# Patient Record
Sex: Male | Born: 1935 | Race: Black or African American | Hispanic: No | Smoking: Former smoker
Health system: Southern US, Community
[De-identification: ages and names within clinical notes are randomized; demographics above are authoritative.]

## PROBLEM LIST (undated history)

## (undated) DIAGNOSIS — E079 Disorder of thyroid, unspecified: Secondary | ICD-10-CM

## (undated) DIAGNOSIS — M1990 Unspecified osteoarthritis, unspecified site: Secondary | ICD-10-CM

## (undated) DIAGNOSIS — I2510 Atherosclerotic heart disease of native coronary artery without angina pectoris: Secondary | ICD-10-CM

## (undated) DIAGNOSIS — I10 Essential (primary) hypertension: Secondary | ICD-10-CM

## (undated) DIAGNOSIS — M549 Dorsalgia, unspecified: Secondary | ICD-10-CM

## (undated) HISTORY — PX: INTESTINAL BYPASS: SHX1099

## (undated) HISTORY — PX: BACK SURGERY: SHX140

## (undated) HISTORY — PX: OTHER SURGICAL HISTORY: SHX169

---

## 2006-01-25 ENCOUNTER — Inpatient Hospital Stay (HOSPITAL_COMMUNITY): Admission: EM | Admit: 2006-01-25 | Discharge: 2006-01-28 | Payer: Self-pay | Admitting: Emergency Medicine

## 2006-01-26 ENCOUNTER — Ambulatory Visit: Payer: Self-pay | Admitting: Cardiology

## 2006-02-04 ENCOUNTER — Encounter (HOSPITAL_COMMUNITY): Admission: RE | Admit: 2006-02-04 | Discharge: 2006-03-06 | Payer: Self-pay | Admitting: Oncology

## 2006-02-12 ENCOUNTER — Ambulatory Visit: Payer: Self-pay | Admitting: Cardiology

## 2006-03-18 ENCOUNTER — Ambulatory Visit: Payer: Self-pay | Admitting: Cardiology

## 2006-08-14 ENCOUNTER — Ambulatory Visit: Payer: Self-pay | Admitting: Cardiology

## 2006-11-23 ENCOUNTER — Inpatient Hospital Stay (HOSPITAL_COMMUNITY): Admission: RE | Admit: 2006-11-23 | Discharge: 2006-11-26 | Payer: Self-pay | Admitting: General Surgery

## 2006-11-23 ENCOUNTER — Encounter (INDEPENDENT_AMBULATORY_CARE_PROVIDER_SITE_OTHER): Payer: Self-pay | Admitting: General Surgery

## 2009-01-16 DIAGNOSIS — E785 Hyperlipidemia, unspecified: Secondary | ICD-10-CM

## 2009-01-16 DIAGNOSIS — R55 Syncope and collapse: Secondary | ICD-10-CM

## 2009-01-16 DIAGNOSIS — I10 Essential (primary) hypertension: Secondary | ICD-10-CM | POA: Insufficient documentation

## 2010-01-25 ENCOUNTER — Ambulatory Visit (HOSPITAL_COMMUNITY): Admission: RE | Admit: 2010-01-25 | Discharge: 2010-01-25 | Payer: Self-pay | Admitting: Pulmonary Disease

## 2010-01-31 ENCOUNTER — Ambulatory Visit: Payer: Self-pay | Admitting: Otolaryngology

## 2010-02-21 ENCOUNTER — Ambulatory Visit: Payer: Self-pay | Admitting: Otolaryngology

## 2010-10-22 NOTE — H&P (Signed)
[name], [name] NO.:  [id number]   MEDICAL RECORD NO.:  [id number]          PATIENT TYPE:  AMB   LOCATION:  DAY                           FACILITY:  [facility]   PHYSICIAN:  Dalia Heading, M.D.  DATE OF BIRTH:  January 10, 1936   DATE OF ADMISSION:  DATE OF DISCHARGE:  LH                              HISTORY & PHYSICAL   CHIEF COMPLAINT:  Neoplasm, colon.   HISTORY OF PRESENT ILLNESS:  Patient is a 75 year old black male who is  referred for evaluation and treatment of a neoplasm in the transverse  colon.  This was found on recent colonoscopy.  It was biopsy-negative  for malignancy but is highly suspicious.  No family history of colon  carcinoma is noted.   PAST MEDICAL HISTORY:  Hypothyroidism, coronary artery disease,  hypertension.   PAST SURGICAL HISTORY:  Thyroidectomy, left carotid endarterectomy,  lithotripsy.   CURRENT MEDICATIONS:  Synthroid, Coreg, baby aspirin, which he is  holding.   ALLERGIES:  No known drug allergies.   REVIEW OF SYSTEMS:  Patient denies drinking or smoking.  He denies any  recent chest pain, MI, CVA, or diabetes mellitus.   PHYSICAL EXAMINATION:  GENERAL:  Patient is a well-developed and well-  nourished black male in no acute distress.  NECK:  Supple without lymphadenopathy or carotid bruits.  LUNGS:  Clear to auscultation with equal breath sounds bilaterally.  HEART:  Regular rate and rhythm without S3, S4, or murmurs.  ABDOMEN:  Soft, nontender, nondistended.  No hepatosplenomegaly, masses,  or hernias are identified.   Colonoscopy revealed a suspicious lesion in the proximal to mid  transverse colon.   IMPRESSION:  Neoplasm, unspecified.   PLAN:  The patient is scheduled for an extended right hemicolectomy on  November 23, 2006.  The risks and benefits of the procedure, including  bleeding, infection, cardiopulmonary difficulties, and the possibility  of a blood transfusion were fully explained to the patient, who  gave  informed consent.      Dalia Heading, M.D.  Electronically Signed     MAJ/MEDQ  D:  11/12/2006  T:  11/12/2006  Job:  [id number]   cc:   [name]  Fax: [phone]

## 2010-10-22 NOTE — Discharge Summary (Signed)
NAMERYON, LAYTON NO.:  [id number]   MEDICAL RECORD NO.:  [id number]          PATIENT TYPE:  INP   LOCATION:  A301                          FACILITY:  [facility]   PHYSICIAN:  Dalia Heading, M.D.  DATE OF BIRTH:  [birth year]   DATE OF ADMISSION:  11/23/2006  DATE OF DISCHARGE:  06/19/2008LH                               DISCHARGE SUMMARY   HOSPITAL COURSE:  SUMMARY:  The patient is a 75 year old black male who  was found on colonoscopy to have a colon neoplasm in the mid-transverse  colon.  Pre-operative CEA level was 0.6.  He was taken to the operating  room on November 23, 2006 and underwent an extended right hemicolectomy.  He  tolerated the procedure well.  His postoperative course has been  unremarkable.  His diet was advanced without  difficulty.  Final  pathology is still pending.  He is being discharged home on  postoperative day #3 in good improving condition.   DISCHARGE INSTRUCTIONS:  The patient is followed up by Dr. Franky Macho  on December 01, 2006.   DISCHARGE MEDICATIONS:  Include:  1. Vicodin 1-2 tablets p.o. q.4 h. p.r.n. pain.  2. Amlodipine 5 mg p.o. daily.  3. Chlorthalidone 12.5 mg p.o. daily.   PRINCIPAL DIAGNOSES:  1. Colon neoplasm.  2. Hypertension.  3. Coronary artery disease.   PRINCIPAL PROCEDURE:  Extended right hemicolectomy on November 23, 2006.      Dalia Heading, M.D.  Electronically Signed     MAJ/MEDQ  D:  11/26/2006  T:  11/26/2006  Job:  [id number]   cc:   Dalia Heading, M.D.  Fax: [phone]   Chart

## 2010-10-22 NOTE — Op Note (Signed)
NAMEALASTOR, [name] NO.:  [id number]   MEDICAL RECORD NO.:  [id number]          PATIENT TYPE:  INP   LOCATION:  A301                          FACILITY:  [facility]   PHYSICIAN:  Dalia Heading, M.D.  DATE OF BIRTH:  1935-11-26   DATE OF PROCEDURE:  11/23/2006  DATE OF DISCHARGE:                               OPERATIVE REPORT   PREOPERATIVE DIAGNOSIS:  Colon neoplasm.   POSTOPERATIVE DIAGNOSIS:  Colon neoplasm, transverse colon.   PROCEDURE:  Extended right hemicolectomy.   SURGEON:  Dr. Franky Macho.   ANESTHESIA:  General endotracheal.   INDICATIONS:  The patient is a 75 year old black male who was found on  routine colonoscopy to have a mid transverse colon neoplasm.  Initial  biopsies were negative, but the neoplasm could not be removed.  The  patient comes the operating for an extended right hemicolectomy.  Risks  and benefits of the procedure including bleeding, infection,  cardiopulmonary difficulties, possible blood transfusion were fully  explained to the patient, who gave informed consent.   PROCEDURE NOTE:  The patient is placed in supine position.  After  induction of general endotracheal anesthesia, the abdomen was prepped  and draped in the usual sterile technique with Betadine.  Surgical site  confirmation was performed.   A midline incision was made from above the umbilicus to below the  umbilicus.  Peritoneal cavity was entered into without difficulty.  The  liver was inspected and noted to be within normal limits.  The  nasogastric tube was noted be in appropriate position in the stomach.  The small bowel was inspected and noted be within normal limits.  The  lesion appeared to be in mid to distal transverse colon.  The right  colon was mobilized along its peritoneal reflection.  A GIA stapler was  placed across the terminal ileum as well as distal to the tumor in the  distal transverse colon.  The mesentery was then divided using the  LigaSure.  The specimen was then opened in the operating room and the  tumor was within the specimen removed.  The specimen sent to pathology  further examination.  A side-to-side ileocolic anastomosis was then  performed using a GIA 70 stapler.  The enterotomy was closed using a TA  stapler.  The staple line was bolstered using 3-0 silk sutures.  Omentum  was then placed over the anastomosis.  This was secured using 3-0 silk  sutures.  The mesenteric defect was closed using a 0-0 chromic gut  running suture.  The abdominal cavity was then copiously irrigated with  gentamicin and normal saline.  All surgical personnel then changed their  gloves.   The bowel was returned to the abdominal cavity in orderly fashion.  The  fascia was reapproximated using a looped 0-0 Novofil running suture.  Subcutaneous layer was irrigated with normal saline and the skin was  closed using staples.  Betadine ointment and dry sterile dressings were  applied.   All tape and needle counts correct end the procedure.  The patient was  extubated in the operating room went back to  recovery room awake in  stable condition.   COMPLICATIONS:  None.   SPECIMEN:  Right colon.   BLOOD LOSS:  Less than 100 mL      Dalia Heading, M.D.  Electronically Signed     MAJ/MEDQ  D:  11/23/2006  T:  11/23/2006  Job:  [id number]

## 2010-10-25 NOTE — H&P (Signed)
[name], COURTWRIGHT NO.:  [id number]   MEDICAL RECORD NO.:  [id number]          PATIENT TYPE:  INP   LOCATION:  A212                          FACILITY:  [facility]   PHYSICIAN:  Hanley Hays. Dechurch, M.D.DATE OF BIRTH:  1936/05/20   DATE OF ADMISSION:  01/25/2006  DATE OF DISCHARGE:  LH                                HISTORY & PHYSICAL   PRIMARY CARE PHYSICIAN:  Dr. Ivory Broad, [place].   This is the first [name] admission for this 75 year old [place] American  gentleman with a history of hypertension who was in his usual state of  health until today when he was at church. He had syncope without warning  while singing.  He was witnessed and apparently fell to the floor over a  bench while singing a hymn.  He was noted to slump forward, grasp the pew in  front of him and tumbled onto the forward pew.  The patient denies any  forewarning or presyncopal-type symptoms.  He apparently had no seizure  activity or incontinence.  He came to lying on his back, unaware of what  transpired, and was fully alert and oriented.  Reportedly, there were some  PVCs noted on telemetry prior to being hooked up to the hard-wired  telemetry, but there was no other arrhythmia or pauses.  He has had no  previous history of syncope or similar symptoms.  He is very active.  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  He denies any dyspnea on  exertion, orthopnea, PND, or chest pain.  He has noted he has felt a fatigue  over the last 3-4 months more than usual but not enough to stop him.  In  any event, the patient is being admitted to the hospital for further  evaluation and treatment.   PAST MEDICAL HISTORY:  Is remarkable for:  1. Hypertension.  2. Nephrolithiasis, status post what sounds like lithotripsy.  3. A remote history of left neck surgery by ENT, question parotid tumor      versus other, without any adjuvant therapies.  He has not been      hospitalized.   He  is allergic to LATEX.   MEDICATIONS:  An unknown blood pressure pill daily and a regular aspirin  daily.  No supplements, over-the-counter products.   SOCIAL HISTORY:  He is widowed.  He has three sons who are alive and well.  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 Denies any alcohol use or abuse.  Smoked  until 1982 but less than a pack per week but is exposed to secondhand smoke.  He is active, mows was own lawn, goes to church regularly.   FAMILY MEDICAL HISTORY:  Two sisters alive and well.  Two brothers alive and  well.  One brother died with a history of diabetes and complications  thereof. Mother died at age 77-1/2 of a heart attack, and father had a  stroke in his late 93s.   REVIEW OF SYSTEMS:  As noted per the HPI.  No GI or GU complaints, sleeps  well.  No weight change, good appetite.  No other symptoms, no orthostasis.  Remote history of migraines but none in many years.   PHYSICAL EXAMINATION:  GENERAL: Reveals a well-developed, well-nourished  gentleman, alert, appropriate.  No distress.  VITAL SIGNS: Blood pressure is 160/100.  NECK:  Supple.  No JVD.  Question of a soft bruit in the left carotid.  PULSES:  Carotid upstrokes are full. Peripheral pulses are all full and  normal.  HEENT: Oropharynx is moist.  He has scarring at the left TMJ area with no  adenopathy.  HEART: Regular rate and rhythm.  No murmur, gallop, rub.  LUNGS:  Clear to auscultation, anterior posterior.  ABDOMEN: Soft, nontender.  EXTREMITIES: No clubbing, cyanosis, no edema.  SKIN: No skin rash, lesion or breakdown.  NEUROLOGIC: Exam is intact.  Blood pressure is not orthostatic.   ASSESSMENT/PLAN:  Syncope without warning.  The patient is admitted to  telemetry. He has a modestly elevated D-dimer at 0.77, but will proceed with  CT imaging of his chest to rule out pulmonary embolus and proceed from  there.  1.  Hypertension, not well controlled.  Will obtain the name of medication      had go ahead and begin and adjust his regimen as indicated.      Echocardiogram, carotid ultrasound are pending.  CT of the brain was      unremarkable.  2. Borderline hypokalemia with potassium of 3.7.  Will go ahead and      proceed with potassium supplementation.  Check magnesium. Will also      check a TSH given his complaints of fatigue over the last several      months. Plan discussed with the patient who expressed reasonable      understanding.  The patient remains a full code.      Hanley Hays Josefine Class, M.D.  Electronically Signed     FED/MEDQ  D:  01/25/2006  T:  01/25/2006  Job:  [id number]

## 2010-10-25 NOTE — Letter (Signed)
February 12, 2006     Dr. Mervyn Skeeters. Haq  [address]  [place], [place] [address]   RE:  [name]  MRN:  [id number]  /  DOB:  [birth year]   Dear Dr. Zola Button:   [name] returns to the office following a recent admission to [facility] for syncope. The mechanism was thought to be neurocardiogenic.  His echocardiogram revealed LVH without other significant structural  abnormalities. A nuclear stress study was negative for ischemia or  infarction. He has done well with minimal orthostatic hypotensive symptoms  in the mornings. He has refrained from driving. His prior work entailed  driving a school bus.   Current medications include amlodipine 5 mg daily and aspirin 325 mg daily.   On exam, a pleasant gentleman in no acute distress. The weight is 242, blood  pressure 150/90, heart rate 80 and regular, respiratory rate 16. NECK:  No  jugular venous distention; normal carotid upstrokes without bruit. HEENT:  Anicteric sclerae; normal lids and conjunctivae. Normal oral mucosa. SKIN:  No significant lesions. LUNGS: Clear. CARDIAC:  Normal first and second  heart sounds; fourth heart sound and minimal systolic ejection murmur  present. ABDOMEN:  Soft and nontender;  no bruit;  no organomegaly.  EXTREMITIES:  No edema; distal pulses intact. NEUROMUSCULAR:  Symmetric  strength and tone;  normal cranial nerves. PSYCHIATRIC:  Alert and oriented;  normal affect.   IMPRESSION:  [name] is doing well from a symptomatic standpoint. We  discussed the issue of driving. Based upon state law, he is precluded from  operating a motor vehicle. He should not drive commercially. I do not  believe that the risk of recurrent syncope is high in his operating a  private automobile, but did explain the state policy to him.   Hypertensive control is important in light of his secondary manifestations  of hypertension, such as LVH. We will change amlodipine to Lotrel 5/20 mg  and check a  chemistry profile and lipid profile in one month. He will be  seen by the cardiology nurses in one month for reassessment of blood  pressure control. He will monitor blood pressure at home and record values  for [place]. I will see him again in four months.    Sincerely,      Gerrit Friends. Dietrich Pates, MD, [facility]   RMR/MedQ  DD:  02/12/2006  DT:  02/12/2006  Job #:  [id number]

## 2010-10-25 NOTE — Letter (Signed)
August 14, 2006    Lacie Scotts. Zola Button, M.D.  [address]  [place], [place] [address]   RE:  MONTRICE, GRACEY  MRN:  [id number]  /  DOB:  April 23, 1936   Dear Dr. Zola Button:   Mr. Cerone returns to the office, now more than 6 months following  admission to [facility] with syncope.  He has also had  hypertension that has been suboptimally controlled in the past.  He has  had no further light-headedness and no further loss of consciousness.  His monitored blood pressures at home with good diastolic results, but  what sounds like somewhat suboptimal systolic results.  The patient  reports some sort of neck surgery at [facility] and also relates  that there was a problem with a blood vessel.  We will seek those  records to determine exactly what occurred.   CURRENT MEDICATIONS:  1. Aspirin 81 mg daily.  2. Chlorthalidone 12.5 mg daily.  3. Amlodipine 5 mg daily.   EXAM:  Pleasant, robust gentleman.  The weight is 246, 4 pounds more than 4 months ago.  Blood pressure  initially 155/90, subsequently 135/80.  Heart rate 75 and regular.  Respirations 16.  NECK:  No jugular venous distension; normal carotid upstrokes without  bruits.  LUNGS:  Clear.  CARDIAC:  Normal first and second heart sounds; grade 2/6 basalar  systolic ejection murmur.  ABDOMEN:  Soft and nontender; no organomegaly.  EXTREMITIES:  No edema.   IMPRESSION:  Mr. Righi is doing generally well.  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  He will discuss  this with the State of [place] if he desires to pursue that capability.  Blood pressure control looks good at present.  He will keep a record of  values at home and return in 3 months for reassessment.  A chemistry  profile will be obtained in light of his use of chlorthalidone.  If  blood pressures are suboptimal, it will be most reasonable to increase  his doses of amlodipine and/or chlorthalidone.    Sincerely,      Gerrit Friends. Dietrich Pates, MD, [facility]  Electronically Signed    RMR/MedQ  DD: 08/14/2006  DT: 08/14/2006  Job #: [id number]

## 2010-10-25 NOTE — Procedures (Signed)
[name], [name] NO.:  [id number]   MEDICAL RECORD NO.:  [id number]          PATIENT TYPE:  INP   LOCATION:  A212                          FACILITY:  [facility]   PHYSICIAN:  Gerrit Friends. Dietrich Pates, MD, FACCDATE OF BIRTH:  [birth year]   DATE OF PROCEDURE:  01/26/2006  DATE OF DISCHARGE:                                  ECHOCARDIOGRAM   REFERRING:  Dr. Josefine Class   CLINICAL DATA:  A 75 year old gentleman with syncope.   1. Technically suboptimal and limited echocardiographic study.  2. Normal left atrium, right atrium, and right ventricle.  3. Mild mitral valve calcification; systolic anterior motion of the      chordae; mild mitral annular calcification.  4. Tricuspid valve not well seen, but is grossly normal.  5. Mild sclerosis of the aortic valve.  6. Aortic root is mildly dilated with calcified walls.  7. Normal left ventricular size; mild to moderate hypertrophy;      hyperdynamic regional and global function.  8. Normal pulmonic artery.      Gerrit Friends. Dietrich Pates, MD, [facility]  Electronically Signed     RMR/MEDQ  D:  01/26/2006  T:  01/27/2006  Job:  [id number]

## 2010-10-25 NOTE — Consult Note (Signed)
[name], STAIRS NO.:  [id number]   MEDICAL RECORD NO.:  [id number]          PATIENT TYPE:  INP   LOCATION:  A212                          FACILITY:  [facility]   PHYSICIAN:  Gerrit Friends. Dietrich Pates, MD, FACCDATE OF BIRTH:  [birth year]   DATE OF CONSULTATION:  01/28/2006  DATE OF DISCHARGE:                                   CONSULTATION   CARDIOLOGY CONSULTATION   REFERRING PHYSICIAN:  Dennison Nancy, M.D.   HISTORY OF PRESENT ILLNESS:  A 75 year old gentleman admitted to hospital  after a syncopal spell.  [name] has no cardiovascular history other  than longstanding hypertension for which he has been treated with a number  of agents, apparently with suboptimal control.  He has not previously  experienced syncope nor dizziness.  He has not previously been seen by a  cardiologist nor undergone any significant cardiac testing.  He been in  church for approximately 2 hours, seated for 1 hour.  When he arose to sing  a hymn, he subsequently was noted to be unsteady on his feet; and then to  slump forward over the pew in front of him.  He struck his upper lip, but  did not note any symptoms of head trauma after he awoke.  He apparently was  unresponsive for a brief period of time.   The rescue squad evaluated him and offered to transport him to the hospital  in [place]; however, he wished to come to [facility] and was  brought here by private automobile.  His workup has been negative including  only modest orthostatic change in blood pressure, an echocardiogram that  shows LVH and mild aortic root dilatation without other significant  findings, negative carotid ultrasound and negative CT.  He also had a CT of  the chest that shows lower lobe atelectasis and cardiomegaly with LVH.  A  nodule was identified in his thyroid gland.   PAST MEDICAL HISTORY:  Notable for nephrolithiasis with prior lithotripsy.  There was a remote surgical exploration of the  left neck--it is unclear what  the exact procedure was.   ALLERGY:  To LATEX.   CURRENT MEDICATIONS:  The only medication appears to be Cardura and aspirin.   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  Remote history of modest cigarette smoking.   FAMILY HISTORY:  Siblings are negative except for one brother with diabetes.  Mother lived to almost 67 and suffered a myocardial infarction.  Father had  a stroke, also at an advanced age.   REVIEW OF SYSTEMS:  Notable for no exertional symptoms, no palpitations, and  a remote history of migraine headache.  All other systems reviewed and are  negative.   PHYSICAL EXAMINATION:  GENERAL:  On exam, pleasant gentleman in no acute  distress.  VITAL SIGNS:  The blood pressure is 160/95 with approximately 10 points of  systolic and diastolic orthostatic change; respirations 16; heart rate 70  and regular.  HEENT:  Anicteric sclerae; pupils equal, round, react to light.  NECK:  No jugular venous distension; normal carotid upstrokes without  bruits.  ENDOCRINE:  No thyromegaly.  HEMATOPOIETIC:  No adenopathy.  SKIN:  No significant lesions.  LUNGS:  Clear.  CARDIAC:  Normal first and second heart sounds; fourth heart sound present.  ABDOMEN:  Soft and nontender; no masses; no organomegaly.  EXTREMITIES:  Normal distal pulses; no edema.  NEUROMUSCULAR:  Symmetric strength and tone; normal cranial nerves.  MUSCULOSKELETAL:  No joint deformities.  PSYCHIATRIC:  Alert and oriented; normal affect.   EKG:  Normal sinus rhythm; first-degree AV block; nondiagnostic inferior Q  waves; ST-T wave abnormalities consistent with anterolateral ischemia or  LVH.   IMPRESSION:  [name] has longstanding hypertension with inadequate  control.  Cardura is not an appropriate first drug.  Amlodipine will be  started at  a dose of 5 mg daily.  I would add ACE inhibitor if that proves  inadequate.  Since he has borderline significant orthostatic change in blood  pressure, I would not favor diuretic as an initial drug.  Beta blocker is  relatively contraindicated due to conduction system disease.   There is no indication in this episode of ischemic heart disease; however,  the patient has significant risk factors plus EKG abnormalities.  We will  proceed with an outpatient stress nuclear study.   The likely mechanism for syncope is neurocardiogenic.  Behavioral mechanisms  for countering this were discussed with the patient.  He will call me if he  experiences recurrent symptoms.  Otherwise, he will follow-up with his  primary medical doctor.      Gerrit Friends. Dietrich Pates, MD, [facility]  Electronically Signed     RMR/MEDQ  D:  01/28/2006  T:  01/28/2006  Job:  [id number]

## 2010-10-25 NOTE — Discharge Summary (Signed)
NAMEJERRITT, CARDOZA              ACCOUNT NO.:  [id number]   MEDICAL RECORD NO.:  [id number]          PATIENT TYPE:  INP   LOCATION:  A212                          FACILITY:  [facility]   PHYSICIAN:  Lonia Blood, M.D.      DATE OF BIRTH:  June 27, 1935   DATE OF ADMISSION:  01/25/2006  DATE OF DISCHARGE:  08/22/2007LH                                 DISCHARGE SUMMARY   PRIMARY CARE PHYSICIAN:  Dr. Edwyna Shell in [place], [place].   DISCHARGE DIAGNOSES:  1. Acute syncopal episode, probably vasovagal, with negative workup.  2. Hypertension.  3. History of previous nephrolithiasis.  4. Remote history of left neck surgery.   DISCHARGE MEDICATIONS:  1. Amlodipine 5 mg daily.  2. Aspirin 325 mg daily.   DISPOSITION:  The patient is currently stable.  No recurrence of syncopal  episode.  He will probably need outpatient stress test.  He has had one done  before.  He has been seen by Cardiology, and the recommendation is for him  to have that done as an outpatient.  Also, if he needs further treatment for  his hypertension, and ACE inhibitor should be placed.   PROCEDURES:  1. Chest x-ray on January 25, 2006, showed cardiomegaly with mild right      base subsegmental atelectasis.  2. CT of head without contrast showed no acute findings.  3. CT angiogram of the chest also showed no evidence of pulmonary      embolism, mild atelectasis in the lower lobes, no acute cardiopulmonary      disease, cardiomegaly with evidence of left ventricular hypertrophy,      solid nodule involving the lobe of the right lobe of the thyroid gland.  4. Carotid Duplex on January 26, 2006, showed no evidence of      hemodynamically significant stenosis on either side.  5. A thyroid ultrasound on August 21 showed dominant solid nodule      associated with lower pole of the right lower thyroid, consider biopsy      and further evaluation.  Will defer this to Dr. Edwyna Shell to arrange thyroid      biopsy.   CONSULTATIONS:   [facility], Dr. Dietrich Pates.   HISTORY OF PRESENT ILLNESS:  Please refer to dictated History and Physical  on admission by Dr. Renne Musca.   The patient is a 75 year old African American male with known history of  hypertension.  The patient had apparently syncopal episode while in the  church while singing.  He fell to the floor over a bench while singing in  the church.  This is his first ever episode and no prior known cardiac  history except for his hypertension.  He was subsequently admitted for  workup of syncopal episode.   HOSPITAL COURSE:  #1 - SYNCOPAL EPISODE.  There was no recurrence in the  hospital.  On admission, the patient's EKG showed what was thought to be  first degree AV block.  That, however, has resolved.  The thinking here is  that the patient had a 2D echocardiogram also that actually was relatively  normal except  for mild dilatation of the aortic root.  The thinking  therefore is that the patient may have had vasovagal syncopal episode.  His  carotid Duplex was negative . Workup including checking his lipid profile  was pending but TSH was within normal limits.  His D. dimer was slightly  elevated at 0.71, but the chest CT was negative.  With these findings, the  patient will be discharged and continue his workup as an outpatient.  We  have tried to get him to have a stress test done.  However, this is being  deferred to be done as an outpatient per Cardiology.   #2 - HYPERTENSION.  His blood pressure has been minimally uncontrolled.  He  has been on Cardura; however, that was deemed to be a poor choice of  medication at this point, especially with his syncopal episode.  Diuretic  would also not be a good choice secondary to his syncopal episode.  His  admission with p.r.n. Tylenol makes it difficult to start him on a beta  blocker, however.  As an [place] American, ACE inhibitors are not likely to  be a first choice of medication.  We will  therefore start him on some  amlodipine now to be adjusted as an outpatient for optimal blood pressure  control.   #3 - THYROID NODULE.  This is a chance finding.  The patients' TSH, as  indicated, was normal.  He will need some follow up for his thyroid nodule  as an outpatient.  Probably including some biopsy.      Lonia Blood, M.D.  Electronically Signed     LG/MEDQ  D:  01/28/2006  T:  01/28/2006  Job:  [id number]   cc:   Gerrit Friends. Dietrich Pates, MD, [facility]  [address]  [place], [place] [address]

## 2010-10-25 NOTE — Group Therapy Note (Signed)
NAMETRACY, KINNER NO.:  [id number]   MEDICAL RECORD NO.:  [id number]          PATIENT TYPE:  INP   LOCATION:  A212                          FACILITY:  [facility]   PHYSICIAN:  Margaretmary Dys, M.D.DATE OF BIRTH:  [birth year]   DATE OF PROCEDURE:  01/26/2006  DATE OF DISCHARGE:                                   PROGRESS NOTE   SUBJECTIVE:  The patient is doing a little bit better today.  Has not had  any more syncopal episodes or nausea.  Has not passed out or had a seizure  since being in the hospital.  It is unclear what precipitated this.  The  patient does report that his blood pressure medication has been changed  several times because of concerns that his blood pressure was a little too  low.  He has no fevers or chills.   OBJECTIVE:  Conscious, alert, comfortable, not in acute distress.  VITAL SIGNS:  Blood pressure 167/100, pulse 71, respirations 22, T-max 97.7.  HEENT EXAM:  Normocephalic, atraumatic.  Oral mucosa was moist with no  exudates.  NECK:  Supple.  No JVD, no lymphadenopathy.  LUNGS:  Clear clinically with good air entry bilaterally.  HEART:  S1 S2.  No S3, S4, gallops or rubs.  ABDOMEN:  Soft, nontender, bowel sounds were positive, no masses palpable.  EXTREMITIES:  No edema.  CENTRAL NERVOUS SYSTEM EXAM:  Grossly intact with no focal deficit.   LABORATORY/DIAGNOSTIC DATA:  Sodium 136, potassium 3.5, chloride 108, CO2  25, glucose 104, BUN 14, creatinine 1.1, calcium 8.1, creatine kinase 274.  Cardiac enzymes were negative.  TSH was 1.076.   Results of MRI of the brain and ultrasound, carotid Dopplers, are pending.   ASSESSMENT AND PLAN:  1. Syncope of unclear etiology.  The patient was noted to be hypotensive      when it occurred.  This may have been iatrogenic of unclear reason,      maybe fatigue or dehydration.  The patient's blood pressure has been on      hold and his blood pressure has actually risen.  I will restart him on  his home medications and see what his blood pressure does here.      Ongoing workup for syncope continues.  Will evaluate as needed.  Echo      has been done and the results are pending.  2. Solid nodule involving the lower pole of the right lobe of the thyroid      gland.  Will continue to watch.  Once the patient is more stable will      plan a thyroid ultrasound to better characterize this nodule.   DISPOSITION:  Patient with syncope of unclear etiology.  Workup is in  evolution.  Will update with more information as data becomes available.      Margaretmary Dys, M.D.  Electronically Signed     AM/MEDQ  D:  01/26/2006  T:  01/26/2006  Job:  [id number]

## 2011-03-26 LAB — DIFFERENTIAL
Basophils Absolute: 0
Basophils Relative: 0
Basophils Relative: 0
Eosinophils Relative: 0
Lymphs Abs: 1.1
Monocytes Absolute: 0.7
Neutro Abs: 7.2

## 2011-03-26 LAB — BASIC METABOLIC PANEL
BUN: 14
Calcium: 8.3 — ABNORMAL LOW
Chloride: 104
Chloride: 106
GFR calc Af Amer: >60
GFR calc non Af Amer: >60
Glucose, Bld: 116 — ABNORMAL HIGH
Sodium: 136
Sodium: 140

## 2011-03-26 LAB — CBC
HCT: 37.5 — ABNORMAL LOW
HCT: 37.7 — ABNORMAL LOW
MCV: 82.9
Platelets: 250
RBC: 4.55
WBC: 6.9
WBC: 9.6

## 2011-03-27 LAB — COMPREHENSIVE METABOLIC PANEL
ALT: 16
AST: 19
Alkaline Phosphatase: 64
Calcium: 9.3
Creatinine, Ser: 1.35
GFR calc Af Amer: >60
GFR calc non Af Amer: 52 — ABNORMAL LOW
Glucose, Bld: 89
Total Bilirubin: 0.9
Total Protein: 6.8

## 2011-03-27 LAB — CROSSMATCH: ABO/RH(D): O POS

## 2011-03-27 LAB — ABO/RH
ABO/RH(D): O POS
ABO/RH(D): O POS

## 2011-03-27 LAB — CBC
HCT: 42.7
Hemoglobin: 14.8
MCHC: 34.7
Platelets: 290

## 2015-11-11 ENCOUNTER — Emergency Department (HOSPITAL_COMMUNITY)
Admission: EM | Admit: 2015-11-11 | Discharge: 2015-11-11 | Disposition: A | Discharge status: Home / Self Care | Payer: Medicare Other | Attending: Emergency Medicine | Admitting: Emergency Medicine

## 2015-11-11 ENCOUNTER — Encounter (HOSPITAL_COMMUNITY): Payer: Self-pay | Admitting: Emergency Medicine

## 2015-11-11 DIAGNOSIS — E860 Dehydration: Secondary | ICD-10-CM | POA: Diagnosis not present

## 2015-11-11 DIAGNOSIS — R55 Syncope and collapse: Secondary | ICD-10-CM

## 2015-11-11 DIAGNOSIS — I10 Essential (primary) hypertension: Secondary | ICD-10-CM | POA: Diagnosis not present

## 2015-11-11 DIAGNOSIS — R531 Weakness: Secondary | ICD-10-CM | POA: Diagnosis present

## 2015-11-11 DIAGNOSIS — N179 Acute kidney failure, unspecified: Secondary | ICD-10-CM | POA: Insufficient documentation

## 2015-11-11 HISTORY — DX: Essential (primary) hypertension: I10

## 2015-11-11 LAB — URINE MICROSCOPIC-ADD ON

## 2015-11-11 LAB — URINALYSIS, ROUTINE W REFLEX MICROSCOPIC
Glucose, UA: NEGATIVE mg/dL
Hgb urine dipstick: NEGATIVE
LEUKOCYTES UA: NEGATIVE
NITRITE: NEGATIVE
Protein, ur: 30 mg/dL — AB
Specific Gravity, Urine: 1.025 (ref 1.005–1.030)
pH: 5 (ref 5.0–8.0)

## 2015-11-11 LAB — CBC WITH DIFFERENTIAL/PLATELET
Basophils Absolute: 0 10*3/uL (ref 0.0–0.1)
Basophils Relative: 0 %
Eosinophils Absolute: 0.1 10*3/uL (ref 0.0–0.7)
Eosinophils Relative: 1 %
HCT: 37.6 % — ABNORMAL LOW (ref 39.0–52.0)
HEMOGLOBIN: 12.2 g/dL — AB (ref 13.0–17.0)
LYMPHS ABS: 1.4 10*3/uL (ref 0.7–4.0)
LYMPHS PCT: 21 %
MCH: 28.8 pg (ref 26.0–34.0)
MCHC: 32.4 g/dL (ref 30.0–36.0)
MCV: 88.7 fL (ref 78.0–100.0)
Monocytes Absolute: 0.6 10*3/uL (ref 0.1–1.0)
Monocytes Relative: 8 %
NEUTROS PCT: 70 %
Neutro Abs: 4.8 10*3/uL (ref 1.7–7.7)
Platelets: 231 10*3/uL (ref 150–400)
RBC: 4.24 MIL/uL (ref 4.22–5.81)
RDW: 13.3 % (ref 11.5–15.5)
WBC: 6.9 10*3/uL (ref 4.0–10.5)

## 2015-11-11 LAB — COMPREHENSIVE METABOLIC PANEL
ALBUMIN: 3.8 g/dL (ref 3.5–5.0)
ALK PHOS: 66 U/L (ref 38–126)
ALT: 9 U/L — ABNORMAL LOW (ref 17–63)
ANION GAP: 7 (ref 5–15)
AST: 12 U/L — ABNORMAL LOW (ref 15–41)
BILIRUBIN TOTAL: 0.8 mg/dL (ref 0.3–1.2)
BUN: 21 mg/dL — ABNORMAL HIGH (ref 6–20)
CALCIUM: 8.8 mg/dL — AB (ref 8.9–10.3)
CO2: 28 mmol/L (ref 22–32)
Chloride: 105 mmol/L (ref 101–111)
Creatinine, Ser: 1.74 mg/dL — ABNORMAL HIGH (ref 0.61–1.24)
GFR calc non Af Amer: 36 mL/min — ABNORMAL LOW (ref >60)
GFR, EST AFRICAN AMERICAN: 41 mL/min — AB (ref >60)
Glucose, Bld: 106 mg/dL — ABNORMAL HIGH (ref 65–99)
POTASSIUM: 3.5 mmol/L (ref 3.5–5.1)
SODIUM: 140 mmol/L (ref 135–145)
TOTAL PROTEIN: 6.8 g/dL (ref 6.5–8.1)

## 2015-11-11 LAB — TROPONIN I: Troponin I: <0.03 ng/mL (ref <0.031)

## 2015-11-11 MED ORDER — SODIUM CHLORIDE 0.9 % IV SOLN
Freq: Once | INTRAVENOUS | Status: AC
  Administered 2015-11-11: 14:00:00 via INTRAVENOUS

## 2015-11-11 NOTE — ED Notes (Signed)
Pt's friend states after church today the pt walked out into the foyer and became weak and diaphoretic.  She states she tried to check his pulse and she couldn't find it.  States she felt it was low and by the time she located it, it had returned to 70.  Pt denies any complaints.

## 2015-11-11 NOTE — Discharge Instructions (Signed)
Drink plenty of fluid this week - keep your urine clear by drinking lots of water Return to the ER if you have the feeling of becoming faint, chest pain or difficulty breathing or any weakness.  Please obtain all of your results from medical records or have your doctors office obtain the results - share them with your doctor - you should be seen at your doctors office in the next 2 days. Call today to arrange your follow up. Take the medications as prescribed. Please review all of the medicines and only take them if you do not have an allergy to them. Please be aware that if you are taking birth control pills, taking other prescriptions, ESPECIALLY ANTIBIOTICS may make the birth control ineffective - if this is the case, either do not engage in sexual activity or use alternative methods of birth control such as condoms until you have finished the medicine and your family doctor says it is OK to restart them. If you are on a blood thinner such as COUMADIN, be aware that any other medicine that you take may cause the coumadin to either work too much, or not enough - you should have your coumadin level rechecked in next 7 days if this is the case.  ?  It is also a possibility that you have an allergic reaction to any of the medicines that you have been prescribed - Everybody reacts differently to medications and while MOST people have no trouble with most medicines, you may have a reaction such as nausea, vomiting, rash, swelling, shortness of breath. If this is the case, please stop taking the medicine immediately and contact your physician.  ?  You should return to the ER if you develop severe or worsening symptoms.

## 2015-11-11 NOTE — ED Provider Notes (Signed)
CSN: [id number]     Arrival date & time 11/11/15  1324 History   First MD Initiated Contact with Patient 11/11/15 1337     Chief Complaint  Patient presents with  . Weakness     (Consider location/radiation/quality/duration/timing/severity/associated sxs/prior Treatment) HPI Comments: The patient is a 80 year old male, he is healthy other than having hypertension and some hearing loss. He states that he was not feeling quite well this morning when he woke up but was nonspecific, he had a bacon and egg breakfast sandwich as well as a couple coffee, went to church and at the end of the church service states that he became acutely weak, diaphoretic and one of his friends who was in the service who is an ICU nurse palpated his pulse and states that he was severely bradycardic. By the time she was able to pull out her watch to count his pulse his pulse it come back to around 70 and he was feeling better. On arrival the patient has no complaints. He has no seizure, no headache, no nausea vomiting or diarrhea and has had a normal appetite. He denies chest pain shortness of breath or abdominal pain and has no swelling in his legs. He denies numbness or weakness of the extremities. His vision is normal without diplopia or blurring. He has had an episode of syncope in the past for which she was seen in the hospital and evaluated him and this also occurred in a church service.  Patient is a 80 y.o. male presenting with weakness. The history is provided by the patient.  Weakness    Past Medical History  Diagnosis Date  . Hypertension    Past Surgical History  Procedure Laterality Date  . Unknown history     History reviewed. No pertinent family history. Social History  Substance Use Topics  . Smoking status: Never Smoker   . Smokeless tobacco: None  . Alcohol Use: No    Review of Systems  Neurological: Positive for weakness.  All other systems reviewed and are negative.     Allergies   Review of patient's allergies indicates no known allergies.  Home Medications   Prior to Admission medications   Not on File   BP 146/80 mmHg  Pulse 75  Temp(Src) 98 F (36.7 C) (Oral)  Resp 20  Ht 6\' 2"  (1.88 m)  Wt 202 lb (91.627 kg)  BMI 25.92 kg/m2  SpO2 97% Physical Exam  Constitutional: He appears well-developed and well-nourished. No distress.  HENT:  Head: Normocephalic and atraumatic.  Mouth/Throat: Oropharynx is clear and moist. No oropharyngeal exudate.  Eyes: Conjunctivae and EOM are normal. Pupils are equal, round, and reactive to light. Right eye exhibits no discharge. Left eye exhibits no discharge. No scleral icterus.  Neck: Normal range of motion. Neck supple. No JVD present. No thyromegaly present.  Cardiovascular: Normal rate, regular rhythm, normal heart sounds and intact distal pulses.  Exam reveals no gallop and no friction rub.   No murmur heard. Pulmonary/Chest: Effort normal and breath sounds normal. No respiratory distress. He has no wheezes. He has no rales.  Abdominal: Soft. Bowel sounds are normal. He exhibits no distension and no mass. There is no tenderness.  Musculoskeletal: Normal range of motion. He exhibits no edema or tenderness.  Lymphadenopathy:    He has no cervical adenopathy.  Neurological: He is alert. Coordination normal.  Skin: Skin is warm and dry. No rash noted. No erythema.  Psychiatric: He has a normal mood and affect. His  behavior is normal.  Nursing note and vitals reviewed.   ED Course  Procedures (including critical care time) Labs Review Labs Reviewed  CBC WITH DIFFERENTIAL/PLATELET - Abnormal; Notable for the following:    Hemoglobin 12.2 (*)    HCT 37.6 (*)    All other components within normal limits  COMPREHENSIVE METABOLIC PANEL - Abnormal; Notable for the following:    Glucose, Bld 106 (*)    BUN 21 (*)    Creatinine, Ser 1.74 (*)    Calcium 8.8 (*)    AST 12 (*)    ALT 9 (*)    GFR calc non Af Amer 36 (*)     GFR calc Af Amer 41 (*)    All other components within normal limits  URINALYSIS, ROUTINE W REFLEX MICROSCOPIC (NOT AT [facility]) - Abnormal; Notable for the following:    Color, Urine STRAW (*)    Bilirubin Urine SMALL (*)    Ketones, ur TRACE (*)    Protein, ur 30 (*)    All other components within normal limits  URINE MICROSCOPIC-ADD ON - Abnormal; Notable for the following:    Squamous Epithelial / LPF 0-5 (*)    Bacteria, UA FEW (*)    Casts HYALINE CASTS (*)    All other components within normal limits  TROPONIN I    Imaging Review No results found. I have personally reviewed and evaluated these images and lab results as part of my medical decision-making.   EKG Interpretation   Date/Time:  Sunday November 11 2015 13:46:37 EDT Ventricular Rate:  76 PR Interval:  241 QRS Duration: 99 QT Interval:  403 QTC Calculation: 453 R Axis:   -11 Text Interpretation:  Sinus rhythm Atrial premature complex Prolonged PR  interval First degree A-V block Abnormal R-wave progression, early  transition since last tracing no significant change Confirmed by Adrianah Prophete   MD, Lynniah Janoski ([id number]) on 11/11/2015 2:13:03 PM      MDM   Final diagnoses:  Near syncope  AKI (acute kidney injury) (HCC)  Dehydration    At this time the patient's exam is unremarkable, his vital signs are stable with no hypertension, hypotension, fever, tachycardia or hypoxia. He has no symptoms, strong pulses in his physical exam is unremarkable. I have reviewed his EKG, there is no acute findings to suggest heart block or other arrhythmia, there is also no findings of ischemia. I will obtain some baseline lab work, cardiac monitoring and some IV fluids and repeat exam.  Labs show AKI - pt given fluids - has otherwise unremarkable w/u - VS normal - no bradyacadria seen - suspect vagal event - likely predisposed due to dehydration  pt and family informed of results Encouraged hydration Needs Cr recheck with Dr. Juanetta Gosling Has  been given 1L of IVF Pt in agreement - and asymptomatic at d/c.  Meds given in ED:  Medications  0.9 %  sodium chloride infusion ( Intravenous New Bag/Given 11/11/15 1417)    New Prescriptions   No medications on file      Eber Hong, MD 11/11/15 1603

## 2016-07-25 ENCOUNTER — Emergency Department (HOSPITAL_COMMUNITY): Payer: Medicare Other

## 2016-07-25 ENCOUNTER — Encounter (HOSPITAL_COMMUNITY): Payer: Self-pay | Admitting: Emergency Medicine

## 2016-07-25 ENCOUNTER — Emergency Department (HOSPITAL_COMMUNITY)
Admission: EM | Admit: 2016-07-25 | Discharge: 2016-07-25 | Disposition: A | Discharge status: Home / Self Care | Payer: Medicare Other | Attending: Emergency Medicine | Admitting: Emergency Medicine

## 2016-07-25 DIAGNOSIS — M5442 Lumbago with sciatica, left side: Secondary | ICD-10-CM | POA: Insufficient documentation

## 2016-07-25 DIAGNOSIS — Z7982 Long term (current) use of aspirin: Secondary | ICD-10-CM | POA: Diagnosis not present

## 2016-07-25 DIAGNOSIS — Z87891 Personal history of nicotine dependence: Secondary | ICD-10-CM | POA: Insufficient documentation

## 2016-07-25 DIAGNOSIS — I10 Essential (primary) hypertension: Secondary | ICD-10-CM | POA: Diagnosis not present

## 2016-07-25 DIAGNOSIS — M5432 Sciatica, left side: Secondary | ICD-10-CM

## 2016-07-25 DIAGNOSIS — I2510 Atherosclerotic heart disease of native coronary artery without angina pectoris: Secondary | ICD-10-CM | POA: Diagnosis not present

## 2016-07-25 DIAGNOSIS — M545 Low back pain: Secondary | ICD-10-CM | POA: Diagnosis present

## 2016-07-25 HISTORY — DX: Dorsalgia, unspecified: M54.9

## 2016-07-25 HISTORY — DX: Unspecified osteoarthritis, unspecified site: M19.90

## 2016-07-25 HISTORY — DX: Disorder of thyroid, unspecified: E07.9

## 2016-07-25 HISTORY — DX: Atherosclerotic heart disease of native coronary artery without angina pectoris: I25.10

## 2016-07-25 MED ORDER — TRAMADOL HCL 50 MG PO TABS: 50.0000 mg | ORAL_TABLET | Freq: Four times a day (QID) | ORAL | 0 refills | Status: AC | PRN

## 2016-07-25 MED ORDER — TRAMADOL HCL 50 MG PO TABS
50.0000 mg | ORAL_TABLET | Freq: Once | ORAL | Status: AC
  Administered 2016-07-25: 50 mg via ORAL
  Filled 2016-07-25: qty 1

## 2016-07-25 MED ORDER — DOCUSATE SODIUM 100 MG PO CAPS: 100.0000 mg | ORAL_CAPSULE | Freq: Two times a day (BID) | ORAL | 0 refills | Status: AC

## 2016-07-25 MED ORDER — PREDNISONE 20 MG PO TABS
20.0000 mg | ORAL_TABLET | Freq: Every day | ORAL | 0 refills | Status: AC
Start: 2016-07-25 — End: 2016-07-29

## 2016-07-25 NOTE — ED Provider Notes (Signed)
Emergency Department Provider Note   I have reviewed the triage vital signs and the nursing notes.   HISTORY  Chief Complaint Hip Pain (since last night- hx of spinal injury in military)   HPI [name] is a 81 y.o. male with PMH of HLD, HTN, and syncope presents to the emergency department for evaluation of pain in the left lower back radiating down his thigh and leg. Patient had acute onset pain this morning. He describes it as a sharp pain. Denies any weakness or numbness. No groin numbness. No urinary retention or incontinence symptoms. Patient reports chronic back pain from an injury in the 1960's but that this pain is different. He is able to walk with some discomfort. No alleviating factors. Pain is severe. No fever or chills. No obvious injury.    Past Medical History:  Diagnosis Date  . Arthritis   . Coronary artery disease   . Hypertension   . Spinal column pain   . Thyroid disease     Patient Active Problem List   Diagnosis Date Noted  . DYSLIPIDEMIA 01/16/2009  . HYPERTENSION 01/16/2009  . SYNCOPE 01/16/2009    Past Surgical History:  Procedure Laterality Date  . BACK SURGERY    . unknown history        Allergies Patient has no known allergies.  No family history on file.  Social History Social History  Substance Use Topics  . Smoking status: Former Games developer  . Smokeless tobacco: Never Used  . Alcohol use No    Review of Systems  Constitutional: No fever/chills Eyes: No visual changes. ENT: No sore throat. Cardiovascular: Denies chest pain. Respiratory: Denies shortness of breath. Gastrointestinal: No abdominal pain.  No nausea, no vomiting.  No diarrhea.  No constipation. Genitourinary: Negative for dysuria. Musculoskeletal: Positive for back pain and left leg pain.  Skin: Negative for rash. Neurological: Negative for headaches, focal weakness or numbness.  10-point ROS otherwise  negative.  ____________________________________________   PHYSICAL EXAM:  VITAL SIGNS: ED Triage Vitals  Enc Vitals Group     BP 07/25/16 1127 (!) 141/115     Pulse Rate 07/25/16 1127 89     Resp 07/25/16 1127 18     Temp 07/25/16 1127 98.2 F (36.8 C)     Temp Source 07/25/16 1127 Oral     SpO2 07/25/16 1127 97 %     Weight 07/25/16 1128 211 lb (95.7 kg)     Height 07/25/16 1128 6' (1.829 m)     Pain Score 07/25/16 1130 10   Constitutional: Alert and oriented. Well appearing and in no acute distress. Eyes: Conjunctivae are normal.  Head: Atraumatic. Nose: No congestion/rhinnorhea. Mouth/Throat: Mucous membranes are moist.  Oropharynx non-erythematous. Neck: No stridor.   Cardiovascular: Normal rate, regular rhythm. Good peripheral circulation. Grossly normal heart sounds.   Respiratory: Normal respiratory effort.  No retractions. Lungs CTAB. Gastrointestinal: Soft and nontender. No distention.  Musculoskeletal: No lower extremity tenderness nor edema. No gross deformities of extremities. Neurologic:  Normal speech and language. No gross focal neurologic deficits are appreciated. Normal sensation and strength in the LEs. Pain with active ROM but not passive.  Skin:  Skin is warm, dry and intact. No rash noted. Psychiatric: Mood and affect are normal. Speech and behavior are normal.  ____________________________________________  RADIOLOGY  Dg Lumbar Spine 2-3 Views  Result Date: 07/25/2016 CLINICAL DATA:  Lower back pain radiating to the left hip since yesterday. No specific injury. EXAM: LUMBAR SPINE - 2-3  VIEW COMPARISON:  None. FINDINGS: Superior endplate fracture at L1 with minimal deformity and chronic appearance. Diffuse spondylosis and mild disc narrowing. No noted facet arthropathy. No endplate erosion or evidence focal bone lesion. Osteopenia and atherosclerosis. Two radiodensities overlapping the mid sacral canal are indeterminate on this exam, possibly related to  chart history of back surgery - considered incidental to the acute presentation. IMPRESSION: 1. No acute finding. 2. L1 superior endplate fracture with minimal height loss and chronic appearance. 3. Generalized spondylosis. Electronically Signed   By: Marnee SpringJonathon  Watts M.D.   On: 07/25/2016 17:02   Dg Hip Unilat W Or Wo Pelvis 2-3 Views Left  Result Date: 07/25/2016 CLINICAL DATA:  Low back pain with left leg pain EXAM: DG HIP (WITH OR WITHOUT PELVIS) 2-3V LEFT COMPARISON:  None. FINDINGS: There is no evidence of hip fracture or dislocation. There is no evidence of arthropathy or other focal bone abnormality. IMPRESSION: Negative. Electronically Signed   By: Marlan Palauharles  Clark M.D.   On: 07/25/2016 17:00    ____________________________________________   PROCEDURES  Procedure(s) performed:   Procedures  None ____________________________________________   INITIAL IMPRESSION / ASSESSMENT AND PLAN / ED COURSE  Pertinent labs & imaging results that were available during my care of the patient were reviewed by me and considered in my medical decision making (see chart for details).  Patient presents to the emergency department for evaluation of left buttock pain radiating down the left leg. Symptoms are most consistent with sciatica. Patient has no red flag signs or symptoms to suggest severe spinal cord stenosis or cauda equina. No fevers or chills to suggest infectious process or septic joint. Patient is ambulatory in the emergency department to the bathroom with some discomfort. Plan to obtain plain films of the lumbar spine and left hip along with pain medication.   05:11 PM No acute finding on x-ray. Patient is feeling better after tramadol. He was ambulatory in the emergency department. Discussed return precautions and warning signs of spinal cord injury or compression. He will follow with his primary care physician in the coming days.   At this time, I do not feel there is any  life-threatening condition present. I have reviewed and discussed all results (EKG, imaging, lab, urine as appropriate), exam findings with patient. I have reviewed nursing notes and appropriate previous records.  I feel the patient is safe to be discharged home without further emergent workup. Discussed usual and customary return precautions. Patient and family (if present) verbalize understanding and are comfortable with this plan.  Patient will follow-up with their primary care provider. If they do not have a primary care provider, information for follow-up has been provided to them. All questions have been answered.  ____________________________________________  FINAL CLINICAL IMPRESSION(S) / ED DIAGNOSES  Final diagnoses:  Sciatica of left side     MEDICATIONS GIVEN DURING THIS VISIT:  Medications  traMADol (ULTRAM) tablet 50 mg (50 mg Oral Given 07/25/16 1610)     NEW OUTPATIENT MEDICATIONS STARTED DURING THIS VISIT:  New Prescriptions   DOCUSATE SODIUM (COLACE) 100 MG CAPSULE    Take 1 capsule (100 mg total) by mouth every 12 (twelve) hours.   PREDNISONE (DELTASONE) 20 MG TABLET    Take 1 tablet (20 mg total) by mouth daily.   TRAMADOL (ULTRAM) 50 MG TABLET    Take 1 tablet (50 mg total) by mouth every 6 (six) hours as needed.      Note:  This document was prepared using [employer] voice recognition  software and may include unintentional dictation errors.  Alona Bene, MD Emergency Medicine   Maia Plan, MD 07/25/16 [phone]

## 2016-07-25 NOTE — Discharge Instructions (Signed)
You have been seen in the Emergency Department (ED)  today for back pain.  Your workup and exam have not shown any acute abnormalities and you are likely suffering from muscle strain or possible problems with your discs, but there is no treatment that will fix your symptoms at this time.  Please take Motrin (ibuprofen) as needed for your pain according to the instructions written on the box.  Alternatively, for the next five days you can take 600mg three times daily with meals (it may upset your stomach). ° °Take Tramadol as prescribed for severe pain. Do not drink alcohol, drive or participate in any other potentially dangerous activities while taking this medication as it may make you sleepy. Do not take this medication with any other sedating medications, either prescription or over-the-counter. If you were prescribed Percocet or Vicodin, do not take these with acetaminophen (Tylenol) as it is already contained within these medications. °  °This medication is an opiate (or narcotic) pain medication and can be habit forming.  Use it as little as possible to achieve adequate pain control.  Do not use or use it with extreme caution if you have a history of opiate abuse or dependence.  If you are on a pain contract with your primary care doctor or a pain specialist, be sure to let them know you were prescribed this medication today from the [facility].  This medication is intended for your use only - do not give any to anyone else and keep it in a secure place where nobody else, especially children, have access to it.  It will also cause or worsen constipation, so you may want to consider taking an over-the-counter stool softener while you are taking this medication. ° °Please follow up with your doctor as soon as possible regarding today's ED visit and your back pain.  Return to the ED for worsening back pain, fever, weakness or numbness of either leg, or if you develop either (1) an  inability to urinate or have bowel movements, or (2) loss of your ability to control your bathroom functions (if you start having "accidents"), or if you develop other new symptoms that concern you. ° °

## 2016-07-25 NOTE — ED Triage Notes (Signed)
New onset of L hip pain since last night-Has hx of spinal injury in [employer]- [facility] [place] [facility], Dr Juanetta GoslingHawkins [place] in [place],

## 2016-08-07 ENCOUNTER — Other Ambulatory Visit: Payer: Self-pay | Admitting: Pulmonary Disease

## 2016-08-07 ENCOUNTER — Other Ambulatory Visit (HOSPITAL_COMMUNITY): Payer: Self-pay | Admitting: Pulmonary Disease

## 2016-08-07 DIAGNOSIS — M545 Low back pain: Secondary | ICD-10-CM

## 2016-08-15 ENCOUNTER — Ambulatory Visit (HOSPITAL_COMMUNITY)
Admission: RE | Admit: 2016-08-15 | Discharge: 2016-08-15 | Disposition: A | Discharge status: Home / Self Care | Payer: Medicare Other | Source: Ambulatory Visit | Attending: Pulmonary Disease | Admitting: Pulmonary Disease

## 2016-08-15 DIAGNOSIS — M4856XA Collapsed vertebra, not elsewhere classified, lumbar region, initial encounter for fracture: Secondary | ICD-10-CM | POA: Diagnosis not present

## 2016-08-15 DIAGNOSIS — M5126 Other intervertebral disc displacement, lumbar region: Secondary | ICD-10-CM | POA: Diagnosis not present

## 2016-08-15 DIAGNOSIS — M545 Low back pain: Secondary | ICD-10-CM | POA: Diagnosis present

## 2016-08-15 DIAGNOSIS — X58XXXA Exposure to other specified factors, initial encounter: Secondary | ICD-10-CM | POA: Diagnosis not present

## 2016-08-27 ENCOUNTER — Other Ambulatory Visit: Payer: Self-pay | Admitting: Pulmonary Disease

## 2016-08-27 DIAGNOSIS — M545 Low back pain: Principal | ICD-10-CM

## 2016-08-27 DIAGNOSIS — G8929 Other chronic pain: Secondary | ICD-10-CM

## 2016-09-02 ENCOUNTER — Other Ambulatory Visit: Payer: Self-pay | Admitting: Pulmonary Disease

## 2016-09-02 ENCOUNTER — Ambulatory Visit
Admission: RE | Admit: 2016-09-02 | Discharge: 2016-09-02 | Disposition: A | Discharge status: Home / Self Care | Payer: Medicare Other | Source: Ambulatory Visit | Attending: Pulmonary Disease | Admitting: Pulmonary Disease

## 2016-09-02 DIAGNOSIS — G8929 Other chronic pain: Secondary | ICD-10-CM

## 2016-09-02 DIAGNOSIS — M545 Low back pain: Principal | ICD-10-CM

## 2016-09-02 MED ORDER — IOPAMIDOL (ISOVUE-M 200) INJECTION 41%
1.0000 mL | Freq: Once | INTRAMUSCULAR | Status: AC
  Administered 2016-09-02: 1 mL via EPIDURAL

## 2016-09-02 MED ORDER — METHYLPREDNISOLONE ACETATE 40 MG/ML INJ SUSP (RADIOLOG
120.0000 mg | Freq: Once | INTRAMUSCULAR | Status: AC
  Administered 2016-09-02: 120 mg via EPIDURAL

## 2016-09-02 NOTE — Discharge Instructions (Signed)

## 2017-01-28 ENCOUNTER — Other Ambulatory Visit: Payer: Self-pay | Admitting: Pulmonary Disease

## 2017-01-28 DIAGNOSIS — M545 Low back pain: Principal | ICD-10-CM

## 2017-01-28 DIAGNOSIS — G8929 Other chronic pain: Secondary | ICD-10-CM

## 2017-05-11 ENCOUNTER — Encounter (HOSPITAL_COMMUNITY): Payer: Self-pay | Admitting: Cardiology

## 2017-05-11 ENCOUNTER — Emergency Department (HOSPITAL_COMMUNITY)
Admission: EM | Admit: 2017-05-11 | Discharge: 2017-05-11 | Disposition: A | Discharge status: Home / Self Care | Payer: Medicare Other | Attending: Emergency Medicine | Admitting: Emergency Medicine

## 2017-05-11 DIAGNOSIS — I10 Essential (primary) hypertension: Secondary | ICD-10-CM | POA: Diagnosis not present

## 2017-05-11 DIAGNOSIS — H9393 Unspecified disorder of ear, bilateral: Secondary | ICD-10-CM | POA: Diagnosis not present

## 2017-05-11 DIAGNOSIS — Z79899 Other long term (current) drug therapy: Secondary | ICD-10-CM | POA: Insufficient documentation

## 2017-05-11 DIAGNOSIS — H9390 Unspecified disorder of ear, unspecified ear: Secondary | ICD-10-CM

## 2017-05-11 DIAGNOSIS — Z7982 Long term (current) use of aspirin: Secondary | ICD-10-CM | POA: Diagnosis not present

## 2017-05-11 DIAGNOSIS — Z87891 Personal history of nicotine dependence: Secondary | ICD-10-CM | POA: Diagnosis not present

## 2017-05-11 DIAGNOSIS — I2510 Atherosclerotic heart disease of native coronary artery without angina pectoris: Secondary | ICD-10-CM | POA: Insufficient documentation

## 2017-05-11 NOTE — ED Triage Notes (Signed)
[facility] to have [facility].  Pt has no complaints

## 2017-05-11 NOTE — ED Provider Notes (Signed)
[facility] EMERGENCY DEPARTMENT Provider Note   CSN: [id number] Arrival date & time: 05/11/17  1408     History   Chief Complaint Chief Complaint  Patient presents with  . Medicare Wellness    HPI [name] is a 81 y.o. male.  Patient is a 81 year old male who presents to the emergency department for evaluation of his ears.  The patient states that he wears hearing aids.  He states that he lost the left one approximately 2-3 weeks ago.  [facility], but he was told to have his ears cleaned before he comes to the ear nose and throat department.  He went to his primary physician, but they are still on vacation.  He was then told to come to the emergency department to have his ears cleaned.  He has not had drainage or discharge from his ears.  He does not have ear pain.  No fever or chills reported.  No nausea vomiting reported.  He presents now for his ears to be clean prior to [facility] evaluation.      Past Medical History:  Diagnosis Date  . Arthritis   . Coronary artery disease   . Hypertension   . Spinal column pain   . Thyroid disease     Patient Active Problem List   Diagnosis Date Noted  . DYSLIPIDEMIA 01/16/2009  . HYPERTENSION 01/16/2009  . SYNCOPE 01/16/2009    Past Surgical History:  Procedure Laterality Date  . BACK SURGERY    . INTESTINAL BYPASS    . unknown history         Home Medications    Prior to Admission medications   Medication Sig Start Date End Date Taking? Authorizing Provider  aspirin EC 81 MG tablet Take 81 mg by mouth daily.    [provider]  docusate sodium (COLACE) 100 MG capsule Take 1 capsule (100 mg total) by mouth every 12 (twelve) hours. 07/25/16   Long, Arlyss RepressJoshua G, MD  naproxen sodium (ALEVE) 220 MG tablet Take 220 mg by mouth daily as needed (for pain).    [provider]  traMADol (ULTRAM) 50 MG tablet  Take 1 tablet (50 mg total) by mouth every 6 (six) hours as needed. 07/25/16   Long, Arlyss RepressJoshua G, MD    Family History History reviewed. No pertinent family history.  Social History Social History   Tobacco Use  . Smoking status: Former Games developermoker  . Smokeless tobacco: Never Used  Substance Use Topics  . Alcohol use: No  . Drug use: No     Allergies   Patient has no known allergies.   Review of Systems Review of Systems  Constitutional: Negative for activity change.       All ROS Neg except as noted in HPI  HENT: Negative for nosebleeds.        Decreased hearing with use of hearing aids  Eyes: Negative for photophobia and discharge.  Respiratory: Negative for cough, shortness of breath and wheezing.   Cardiovascular: Negative for chest pain and palpitations.  Gastrointestinal: Negative for abdominal pain and blood in stool.  Genitourinary: Negative for dysuria, frequency and hematuria.  Musculoskeletal: Positive for arthralgias. Negative for back pain and neck pain.  Skin: Negative.   Neurological: Negative for dizziness, seizures and speech difficulty.  Psychiatric/Behavioral: Negative for confusion and hallucinations.     Physical Exam Updated Vital Signs BP (!) 149/72   Pulse 72  Temp (!) 97.4 F (36.3 C) (Oral)   Resp 16   Ht 6\' 2"  (1.88 m)   Wt 103 kg (227 lb)   SpO2 98%   BMI 29.15 kg/m   Physical Exam  Constitutional: He is oriented to person, place, and time. He appears well-developed and well-nourished.  Non-toxic appearance.  HENT:  Head: Normocephalic.  Right Ear: Tympanic membrane and external ear normal.  Left Ear: Tympanic membrane and external ear normal.  There is no lesions or increased redness of the external ears on the right or the left.  There is no swelling or tenderness of the mastoid area.  The external auditory canals show minimal cerumen bilaterally.  The tympanic membranes are within normal limits.  There are no pre-or postauricular  nodes palpated.  Eyes: EOM and lids are normal. Pupils are equal, round, and reactive to light.  Neck: Normal range of motion. Neck supple. Carotid bruit is not present.  Cardiovascular: Normal rate, regular rhythm, intact distal pulses and normal pulses.  Pulmonary/Chest: Breath sounds normal. No respiratory distress.  Abdominal: Soft. Bowel sounds are normal. There is no tenderness. There is no guarding.  Musculoskeletal: Normal range of motion.  Lymphadenopathy:       Head (right side): No submandibular adenopathy present.       Head (left side): No submandibular adenopathy present.    He has no cervical adenopathy.  Neurological: He is alert and oriented to person, place, and time. He has normal strength. No cranial nerve deficit or sensory deficit.  Skin: Skin is warm and dry.  Psychiatric: He has a normal mood and affect. His speech is normal.  Nursing note and vitals reviewed.    ED Treatments / Results  Labs (all labs ordered are listed, but only abnormal results are displayed) Labs Reviewed - No data to display  EKG  EKG Interpretation None       Radiology No results found.  Procedures Procedures (including critical care time)  Medications Ordered in ED Medications - No data to display   Initial Impression / Assessment and Plan / ED Course  I have reviewed the triage vital signs and the nursing notes.  Pertinent labs & imaging results that were available during my care of the patient were reviewed by me and considered in my medical decision making (see chart for details).       Final Clinical Impressions(s) / ED Diagnoses MdM Pt seen with me by Dr Ethelda ChickJacubowitz. Ears irrigated without problem.  Small amount of wax removed.  Ears rechecked.  The tympanic membranes are easily visualized.  Minimal to no wax noted in the external canal.  At discharge the patient's blood pressure was elevated.  A manual blood pressure was obtained.  The patient's blood pressure  showed a diastolic pressure of 102.  The patient states however he is not had his medication.  He has not eaten all day, and he has been here in the emergency department for 5 hours.  I have asked the patient to make sure he takes his medication as soon as he gets home.  He is to have his pressure rechecked by his MD this week.  He will return to the emergency department for evaluation if unable to be rechecked by primary care physician.   Final diagnoses:  Ear problem, unspecified laterality  Essential hypertension    ED Discharge Orders    None       Ivery QualeBryant, Darianne Muralles, PA-C 05/11/17 1931    Doug SouJacubowitz, Sam, MD  05/12/17 0014  

## 2017-05-11 NOTE — Discharge Instructions (Signed)
Your ears have been irrigated.  The portions of wax that could be removed safely have been removed.  Please see your ear nose and throat specialist at the [facility] as scheduled.  Please see Dr. Juanetta GoslingHawkins, or return to the emergency department if any changes, problems, or concerns before you are seen by the [facility] specialist.

## 2017-05-11 NOTE — ED Provider Notes (Signed)
Complains of clogged feeling in both ears.  Feels improved after ears irrigated in the emergency department.  On exam no distress bilateral ear canals clean.  Tympanic membranes normal   Doug SouJacubowitz, Audrinna Sherman, MD 05/11/17 [phone]

## 2017-05-11 NOTE — ED Notes (Signed)
Bilateral ears irrigated by this Clinical research associatewriter. Small amounts of wax noted to come out as water and hydrogen peroxide mixed noted.

## 2017-06-09 IMAGING — DX DG HIP (WITH OR WITHOUT PELVIS) 2-3V*L*
3 series · 3 of 3 positions shown · non-contrast
Comparison: None.

CLINICAL DATA: Low back pain with left leg pain

EXAM:
DG HIP (WITH OR WITHOUT PELVIS) 2-3V LEFT

[pelvis ap]
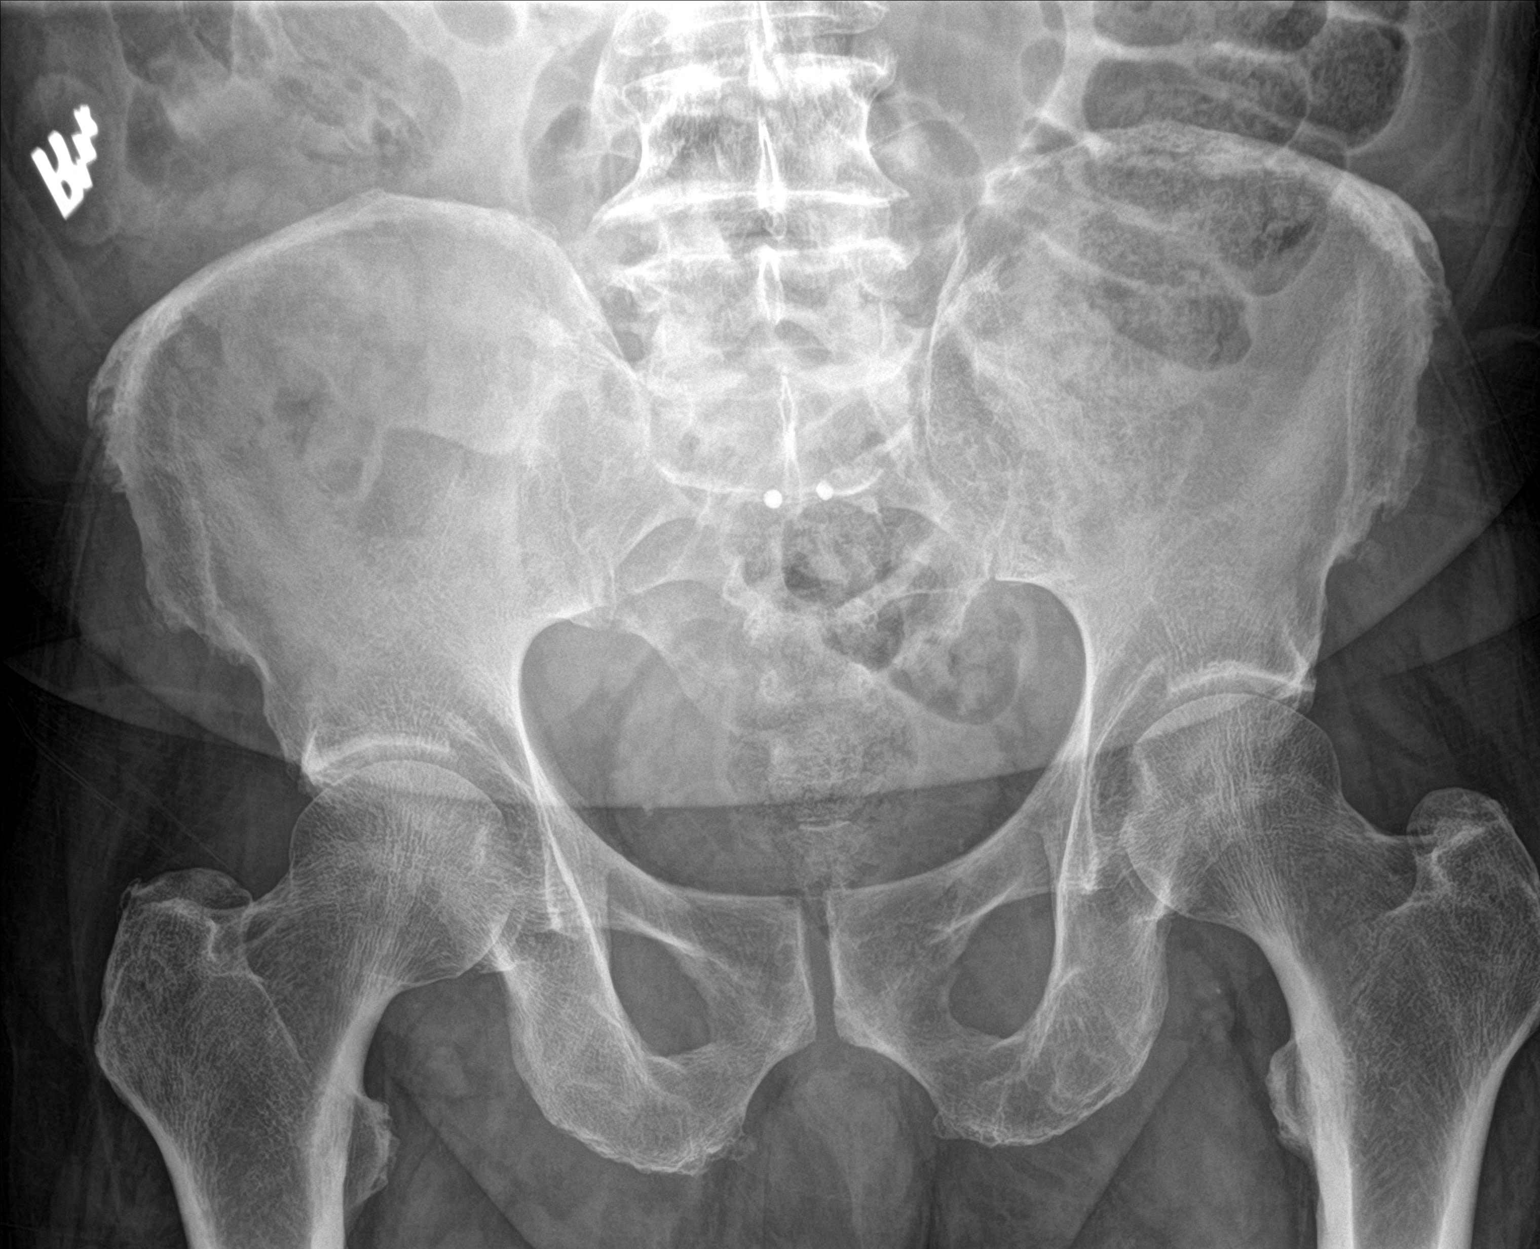

[hip ap]
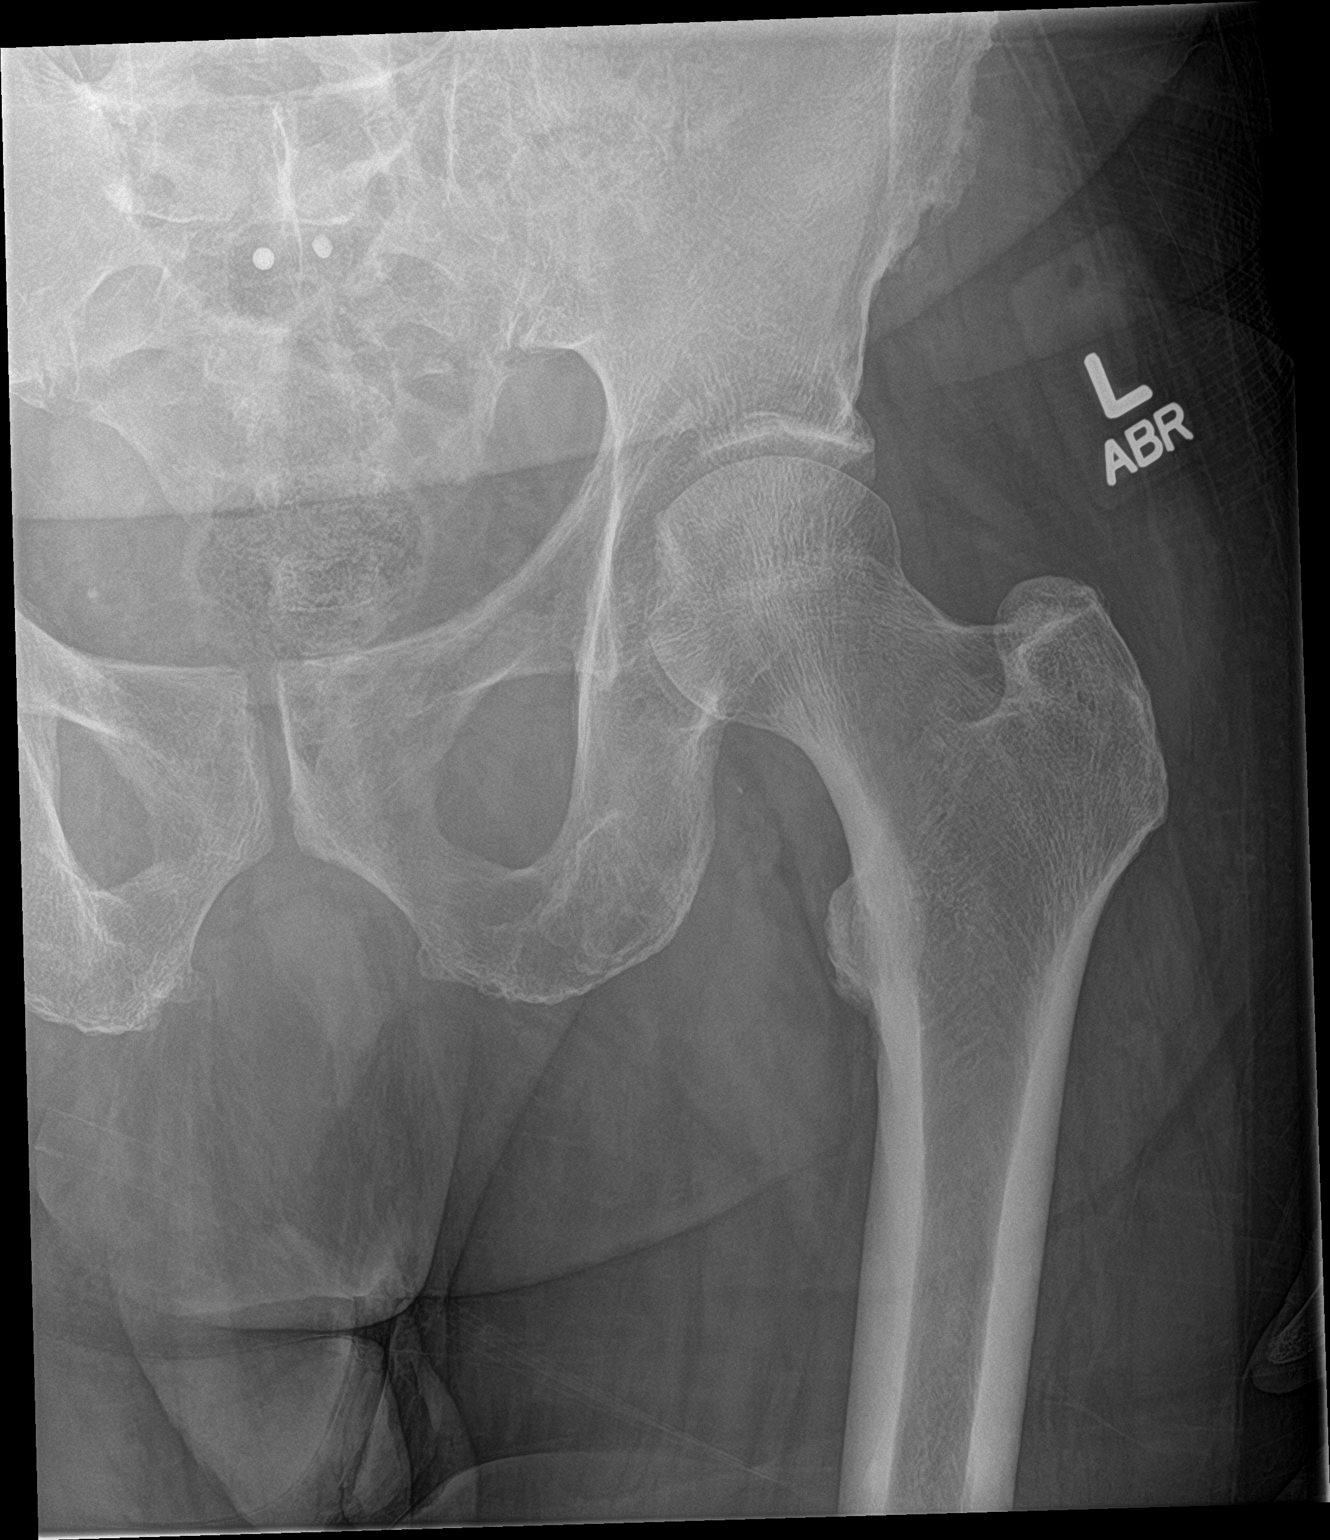

[hip lat]
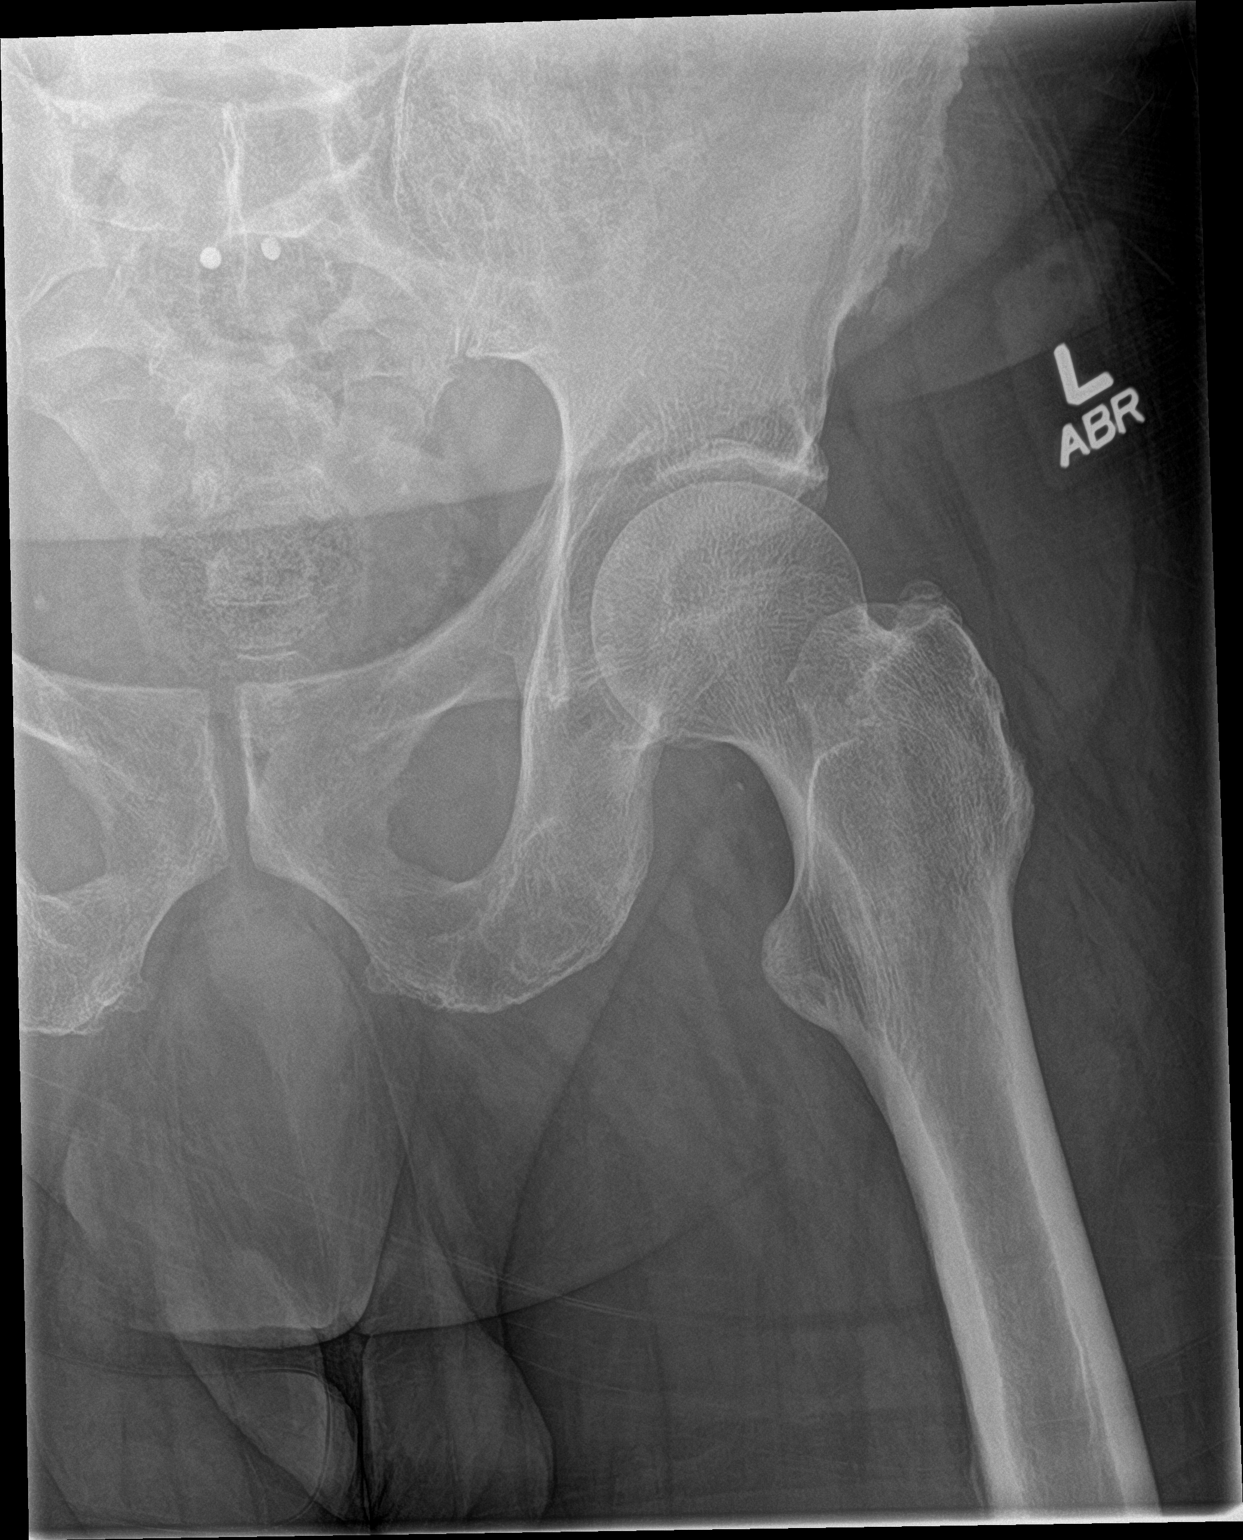

[3 of 3 positions shown; findings below may reference images not displayed]

FINDINGS: There is no evidence of hip fracture or dislocation. There is no
evidence of arthropathy or other focal bone abnormality.
IMPRESSION: Negative.

## 2024-03-09 DEATH — deceased
